# Patient Record
Sex: Female | Born: 1950 | Race: White | Hispanic: No | Marital: Married | Smoking: Never smoker
Health system: Southern US, Community
[De-identification: ages and names within clinical notes are randomized; demographics above are authoritative.]

## PROBLEM LIST (undated history)

## (undated) DIAGNOSIS — I10 Essential (primary) hypertension: Secondary | ICD-10-CM

## (undated) DIAGNOSIS — I2510 Atherosclerotic heart disease of native coronary artery without angina pectoris: Secondary | ICD-10-CM

## (undated) DIAGNOSIS — R51 Headache: Secondary | ICD-10-CM

## (undated) DIAGNOSIS — R112 Nausea with vomiting, unspecified: Secondary | ICD-10-CM

## (undated) DIAGNOSIS — Z9889 Other specified postprocedural states: Secondary | ICD-10-CM

## (undated) DIAGNOSIS — G4730 Sleep apnea, unspecified: Secondary | ICD-10-CM

## (undated) DIAGNOSIS — I209 Angina pectoris, unspecified: Secondary | ICD-10-CM

## (undated) DIAGNOSIS — R519 Headache, unspecified: Secondary | ICD-10-CM

## (undated) DIAGNOSIS — K219 Gastro-esophageal reflux disease without esophagitis: Secondary | ICD-10-CM

## (undated) HISTORY — PX: COLONOSCOPY: SHX5424

## (undated) HISTORY — PX: CARDIAC CATHETERIZATION: SHX172

## (undated) HISTORY — PX: MEDIAN STERNOTOMY: SUR860

## (undated) HISTORY — PX: CHOLECYSTECTOMY: SHX55

## (undated) HISTORY — PX: ENDARTERECTOMY: SHX5162

## (undated) HISTORY — PX: ESOPHAGOGASTRODUODENOSCOPY: SHX1529

## (undated) HISTORY — PX: AORTIC VALVE REPLACEMENT: SHX41

---

## 2012-08-01 HISTORY — PX: SPHENOIDECTOMY: SHX2421

## 2015-08-02 HISTORY — PX: ABDOMINAL HYSTERECTOMY: SHX81

## 2017-12-29 ENCOUNTER — Other Ambulatory Visit: Payer: Self-pay | Admitting: Neurological Surgery

## 2018-01-08 ENCOUNTER — Inpatient Hospital Stay (HOSPITAL_COMMUNITY): Admission: RE | Admit: 2018-01-08 | Payer: Self-pay | Source: Ambulatory Visit

## 2018-01-11 ENCOUNTER — Encounter (HOSPITAL_COMMUNITY): Admission: RE | Payer: Self-pay | Source: Ambulatory Visit

## 2018-01-11 ENCOUNTER — Inpatient Hospital Stay (HOSPITAL_COMMUNITY): Admission: RE | Admit: 2018-01-11 | Payer: Medicare Other | Source: Ambulatory Visit | Admitting: Neurological Surgery

## 2018-01-11 SURGERY — ANTERIOR CERVICAL DECOMPRESSION/DISCECTOMY FUSION 2 LEVELS
Anesthesia: General

## 2018-01-31 ENCOUNTER — Other Ambulatory Visit: Payer: Self-pay | Admitting: Neurological Surgery

## 2018-02-20 ENCOUNTER — Other Ambulatory Visit (HOSPITAL_COMMUNITY): Payer: Medicare Other

## 2018-02-20 NOTE — Pre-Procedure Instructions (Signed)
Eual FinesGail D Bartosiewicz  02/20/2018      [facility] [id number] - [place], [place] - [address] AT [facility] [address] [place] [id number] Phone: [phone] Fax: [phone]    Your procedure is scheduled on February 28, 2018.  [facility] at 745 AM.  Call this number if you have problems the morning of surgery:  [phone]   Remember:  Do not eat or drink after midnight.    Take these medicines the morning of surgery with A SIP OF WATER  Zyrtec Restasis eye drops Diazepam (valium)-if needed flonase nasal spray Meclizine (antivert) Metoprolol succinate (toprol-XL) Pantoprazole (protonix) sucrafate (carafate)-if needed Tramadol (ultram)-if needed for pain  Follow your surgeon's instructions on when to hold/resume aspirin.  If no instructions were given call the office to determine how they would like to you take aspirin  7 days prior to surgery STOP taking any diclofenac (voltaren), Aspirin (unless otherwise instructed by your surgeon), Aleve, Naproxen, Ibuprofen, Motrin, Advil, Goody's, BC's, all herbal medications, fish oil, and all vitamins    Do not wear jewelry, make-up or nail polish.  Do not wear lotions, powders, or perfumes, or deodorant.  Do not shave 48 hours prior to surgery.  Men may shave face and neck.  Do not bring valuables to the hospital.  [facility] is not responsible for any belongings or valuables.  Contacts, dentures or bridgework may not be worn into surgery.  Leave your suitcase in the car.  After surgery it may be brought to your room.  For patients admitted to the hospital, discharge time will be determined by your treatment team.  Patients discharged the day of surgery will not be allowed to drive home.    [facility]- Preparing For Surgery  Before surgery, you can play an important role. Because skin is not sterile, your skin needs to be as free of germs as  possible. You can reduce the number of germs on your skin by washing with CHG (chlorahexidine gluconate) Soap before surgery.  CHG is an antiseptic cleaner which kills germs and bonds with the skin to continue killing germs even after washing.    Oral Hygiene is also important to reduce your risk of infection.  Remember - BRUSH YOUR TEETH THE MORNING OF SURGERY WITH YOUR REGULAR TOOTHPASTE  Please do not use if you have an allergy to CHG or antibacterial soaps. If your skin becomes reddened/irritated stop using the CHG.  Do not shave (including legs and underarms) for at least 48 hours prior to first CHG shower. It is OK to shave your face.  Please follow these instructions carefully.   1. Shower the NIGHT BEFORE SURGERY and the MORNING OF SURGERY with CHG.   2. If you chose to wash your hair, wash your hair first as usual with your normal shampoo.  3. After you shampoo, rinse your hair and body thoroughly to remove the shampoo.  4. Use CHG as you would any other liquid soap. You can apply CHG directly to the skin and wash gently with a scrungie or a clean washcloth.   5. Apply the CHG Soap to your body ONLY FROM THE NECK DOWN.  Do not use on open wounds or open sores. Avoid contact with your eyes, ears, mouth and genitals (private parts). Wash Face and genitals (private parts)  with your normal soap.  6. Wash thoroughly, paying special attention to the area where your  surgery will be performed.  7. Thoroughly rinse your body with warm water from the neck down.  8. DO NOT shower/wash with your normal soap after using and rinsing off the CHG Soap.  9. Pat yourself dry with a CLEAN TOWEL.  10. Wear CLEAN PAJAMAS to bed the night before surgery, wear comfortable clothes the morning of surgery  11. Place CLEAN SHEETS on your bed the night of your first shower and DO NOT SLEEP WITH PETS.  Day of Surgery:  Do not apply any deodorants/lotions.  Please wear clean clothes to the  hospital/surgery center.   Remember to brush your teeth WITH YOUR REGULAR TOOTHPASTE.  Please read over the following fact sheets that you were given. Pain Booklet, Coughing and Deep Breathing, MRSA Information and Surgical Site Infection Prevention

## 2018-02-21 ENCOUNTER — Other Ambulatory Visit: Payer: Self-pay

## 2018-02-21 ENCOUNTER — Encounter (HOSPITAL_COMMUNITY)
Admission: RE | Admit: 2018-02-21 | Discharge: 2018-02-21 | Disposition: A | Discharge status: Home / Self Care | Payer: Medicare Other | Source: Ambulatory Visit | Attending: Neurological Surgery | Admitting: Neurological Surgery

## 2018-02-21 ENCOUNTER — Encounter (HOSPITAL_COMMUNITY): Payer: Self-pay

## 2018-02-21 DIAGNOSIS — M542 Cervicalgia: Secondary | ICD-10-CM | POA: Diagnosis not present

## 2018-02-21 DIAGNOSIS — I2510 Atherosclerotic heart disease of native coronary artery without angina pectoris: Secondary | ICD-10-CM | POA: Diagnosis not present

## 2018-02-21 DIAGNOSIS — G4730 Sleep apnea, unspecified: Secondary | ICD-10-CM | POA: Insufficient documentation

## 2018-02-21 DIAGNOSIS — Z79899 Other long term (current) drug therapy: Secondary | ICD-10-CM | POA: Insufficient documentation

## 2018-02-21 DIAGNOSIS — Z7982 Long term (current) use of aspirin: Secondary | ICD-10-CM | POA: Diagnosis not present

## 2018-02-21 DIAGNOSIS — I10 Essential (primary) hypertension: Secondary | ICD-10-CM | POA: Insufficient documentation

## 2018-02-21 DIAGNOSIS — K219 Gastro-esophageal reflux disease without esophagitis: Secondary | ICD-10-CM | POA: Diagnosis not present

## 2018-02-21 DIAGNOSIS — Z01818 Encounter for other preprocedural examination: Secondary | ICD-10-CM | POA: Diagnosis present

## 2018-02-21 HISTORY — DX: Angina pectoris, unspecified: I20.9

## 2018-02-21 HISTORY — DX: Other specified postprocedural states: Z98.890

## 2018-02-21 HISTORY — DX: Atherosclerotic heart disease of native coronary artery without angina pectoris: I25.10

## 2018-02-21 HISTORY — DX: Sleep apnea, unspecified: G47.30

## 2018-02-21 HISTORY — DX: Headache: R51

## 2018-02-21 HISTORY — DX: Headache, unspecified: R51.9

## 2018-02-21 HISTORY — DX: Other specified postprocedural states: R11.2

## 2018-02-21 HISTORY — DX: Gastro-esophageal reflux disease without esophagitis: K21.9

## 2018-02-21 HISTORY — DX: Essential (primary) hypertension: I10

## 2018-02-21 LAB — CBC WITH DIFFERENTIAL/PLATELET
ABS IMMATURE GRANULOCYTES: 0 10*3/uL (ref 0.0–0.1)
BASOS ABS: 0.1 10*3/uL (ref 0.0–0.1)
Basophils Relative: 1 %
Eosinophils Absolute: 0.3 10*3/uL (ref 0.0–0.7)
Eosinophils Relative: 3 %
HCT: 41.2 % (ref 36.0–46.0)
Hemoglobin: 13.7 g/dL (ref 12.0–15.0)
Immature Granulocytes: 0 %
Lymphocytes Relative: 31 %
Lymphs Abs: 2.7 10*3/uL (ref 0.7–4.0)
MCH: 29.7 pg (ref 26.0–34.0)
MCHC: 33.3 g/dL (ref 30.0–36.0)
MCV: 89.2 fL (ref 78.0–100.0)
Monocytes Absolute: 0.7 10*3/uL (ref 0.1–1.0)
Monocytes Relative: 8 %
NEUTROS ABS: 5 10*3/uL (ref 1.7–7.7)
Neutrophils Relative %: 57 %
PLATELETS: 238 10*3/uL (ref 150–400)
RBC: 4.62 MIL/uL (ref 3.87–5.11)
RDW: 12.7 % (ref 11.5–15.5)
WBC: 8.8 10*3/uL (ref 4.0–10.5)

## 2018-02-21 LAB — SURGICAL PCR SCREEN
MRSA, PCR: NEGATIVE
STAPHYLOCOCCUS AUREUS: NEGATIVE

## 2018-02-21 LAB — BASIC METABOLIC PANEL
ANION GAP: 11 (ref 5–15)
BUN: 28 mg/dL — ABNORMAL HIGH (ref 8–23)
CO2: 21 mmol/L — ABNORMAL LOW (ref 22–32)
Calcium: 9.6 mg/dL (ref 8.9–10.3)
Chloride: 107 mmol/L (ref 98–111)
Creatinine, Ser: 1 mg/dL (ref 0.44–1.00)
GFR, EST NON AFRICAN AMERICAN: 58 mL/min — AB (ref >60)
Glucose, Bld: 123 mg/dL — ABNORMAL HIGH (ref 70–99)
POTASSIUM: 4.2 mmol/L (ref 3.5–5.1)
SODIUM: 139 mmol/L (ref 135–145)

## 2018-02-21 LAB — PROTIME-INR
INR: 0.93
PROTHROMBIN TIME: 12.4 s (ref 11.4–15.2)

## 2018-02-21 NOTE — Progress Notes (Signed)
PCP - Marianna FussBrittany Hayes Cardiologist - Christie Nottinghamahul Sharma  Chest x-ray - not needed EKG - request Stress Test - request ECHO - request Cardiac Cath - request  Sleep Study - requesting CPAP - at night instructed to bring with her the moring   Aspirin Instructions:last dose 02/20/18  Anesthesia review: yes, records requested, aortic valve replacement  Patient denies shortness of breath, fever, cough and chest pain at PAT appointment   Patient verbalized understanding of instructions that were given to them at the PAT appointment. Patient was also instructed that they will need to review over the PAT instructions again at home before surgery.

## 2018-02-22 ENCOUNTER — Encounter (HOSPITAL_COMMUNITY): Payer: Self-pay

## 2018-02-22 NOTE — Progress Notes (Signed)
Anesthesia Chart Review:  Case:  [id number] Date/Time:  02/28/18 0930   Procedure:  ACDF - C4-C5 - C5-C6 (N/A )   Anesthesia type:  General   Pre-op diagnosis:  Cervicalgia   Location:  [facility] OR ROOM 19 / [facility] OR   Surgeon:  Tia AlertJones, David S, MD      DISCUSSION: Patient is a 67 year old female scheduled for the above procedure. 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 04/11/16, Amado Coeoselli, Eric, [facility], [facility]), PAF (post-op 04/2016, 05/2016), HTN, OSA (CPAP), GERD.  Cardiologist Dr. Wasco CallasSharma gave his "professional cardiac opinion that the patietn is otherwise cardiac-optimized for the above procedure."  If up to date EKG not received then she will need one on the day of surgery. By notes, she has not had recurrent PAF since ~ 05/2016. If no acute changes then I anticipate that she can proceed as planned.   VS: BP (!) 172/57   Pulse 70   Temp 36.8 C   Resp 20   Ht 5\' 3"  (1.6 m)   Wt 223 lb 9.6 oz (101.4 kg)   SpO2 98%   BMI 39.61 kg/m   PROVIDERS: Marianna FussHayes, Brittany, DO is PCP [facility], see Care Everywhere). (Sleep study was requested from Lorenza BurtonFavero, J. Patrick, [facility] Christie NottinghamSharma, Rahul, MD is cardiologist [facility], see Care Everywhere). Last office visit 11/08/17. Echo stable. Follow-up 06/2018 planned.    LABS: Labs reviewed: Acceptable for surgery. (all labs ordered are listed, but only abnormal results are displayed)  Labs Reviewed  BASIC METABOLIC PANEL - Abnormal; Notable for the following components:      Result Value   CO2 21 (*)    Glucose, Bld 123 (*)    BUN 28 (*)    GFR calc non Af Amer 58  (*)    All other components within normal limits  SURGICAL PCR SCREEN  CBC WITH DIFFERENTIAL/PLATELET  PROTIME-INR    IMAGES: CXR 10/03/17 [facility] Everywhere): IMPRESSION: No radiographic evidence of an acute cardiopulmonary process. No interval change.   EKG: 03/13/17 [facility]): According to result narrative in Care Everywhere, EKG showed: NSR, normal ECG. If unable to get a copy of an EKG tracing done within the past year then she will need an EKG on the day of surgery.    CV: Echo 11/08/17 [facility] Everywhere): Summary  1. Overall left ventricular ejection fraction is estimated at 60 to 65%.  2. Normal global left ventricular systolic function.  3. (Grade 2) Moderately abnormal left ventricular diastolic filling.  4. S/p basal septal myectomy.  5. Mild mitral annular calcification.  6. The aortic valve appears to be a normally functioning bioprosthetic valve.  7. Elevated left ventricular end diastolic pressure by tissue Doppler.  Cardiac cath 02/09/16 (PRE-AVR; [facility] Everywhere): Coronary Arteriography: Left Main Normal caliber vessel with no obstructive disease.  LAD Normal caliber transapical vessel with a normal branching first  diagonal. There is a 30% early mid LAD stenosis. The mid to distal LAD  takes an intramyocardial course and there is a moderate bridge.  Left Circumflex Normal caliber non-dominant vessel with a normal first  obtuse marginal, diminutive second and third diagonals and a large LPL. No  disease. RCA Normal caliber dominant system  with normal PDA normal PL. There is a  10-20% stenosis in the early mid RCA.  Hemodynamics:  See technician's report for full detail. The LVEDP was 22 mmHg. There was  a 60+mm Hg pressure gradient across the aortic valve. Using an end-hole  catheter an intracavitary pressure was sustained from apex to base. Left Ventriculography:  Deferred.  Carotid U/S 11/2213 [facility] Everywhere):   Impression:  1. The right ICA demonstrates mild plaque deposition with a stenosis in the lower limits of 0-39%. 2. The left ICA demonstrates mild plaque deposition with a stenosis in the lower limits of 0-39%.   Past Medical History:  Diagnosis Date  . Anginal pain (HCC)    hx  . Coronary artery disease   . GERD (gastroesophageal reflux disease)   . Headache    miagrains  . Hypertension   . PONV (postoperative nausea and vomiting)   . Sleep apnea    CPAP at night    Past Surgical History:  Procedure Laterality Date  . ABDOMINAL HYSTERECTOMY  2017  . AORTIC VALVE REPLACEMENT     23-mm Trifecta bioprosthesis, aortic root enlargement with bovine pericardal patch, left ventricular myectomy with reduction of papillary muscle 04/11/16, Dr. Nickie Retort, [facility]  . CARDIAC CATHETERIZATION    . CHOLECYSTECTOMY    . COLONOSCOPY    . ENDARTERECTOMY     ascending aorta 04/11/16 (during AVR)  . ESOPHAGOGASTRODUODENOSCOPY    . MEDIAN STERNOTOMY    . SPHENOIDECTOMY  2014   mass removed     MEDICATIONS: . aspirin EC 81 MG tablet  . cetirizine (ZYRTEC) 10 MG tablet  . cyclobenzaprine (FLEXERIL) 5 MG tablet  . cycloSPORINE (RESTASIS) 0.05 % ophthalmic emulsion  . diazepam (VALIUM) 2 MG tablet  . diclofenac (VOLTAREN) 75 MG EC tablet  . fluticasone (FLONASE) 50 MCG/ACT nasal spray  . ibuprofen (ADVIL,MOTRIN) 200 MG tablet  . meclizine (ANTIVERT) 12.5 MG tablet  . metoprolol succinate (TOPROL-XL) 100 MG 24 hr tablet  . montelukast (SINGULAIR) 10 MG tablet  . Multiple Vitamin (MULTIVITAMIN WITH MINERALS) TABS tablet  . Multiple Vitamins-Minerals (PRESERVISION AREDS 2 PO)  . NON FORMULARY  . olmesartan-hydrochlorothiazide (BENICAR HCT) 40-12.5 MG tablet  . pantoprazole (PROTONIX) 40 MG tablet  . simvastatin (ZOCOR) 20 MG tablet  . sucralfate (CARAFATE) 1 g tablet  . tapentadol (NUCYNTA) 50 MG tablet  . traMADol (ULTRAM) 50 MG tablet   No current facility-administered  medications for this encounter.    Last ASA dose 02/20/18.    Velna [facility] Short Stay Center/Anesthesiology Phone [phone] 02/22/2018 11:36 AM

## 2018-02-28 ENCOUNTER — Encounter (HOSPITAL_COMMUNITY)
Admission: RE | Disposition: A | Discharge status: Home / Self Care | Payer: Self-pay | Source: Ambulatory Visit | Attending: Neurological Surgery

## 2018-02-28 ENCOUNTER — Inpatient Hospital Stay (HOSPITAL_COMMUNITY): Payer: Medicare Other

## 2018-02-28 ENCOUNTER — Encounter (HOSPITAL_COMMUNITY): Payer: Self-pay | Admitting: Surgery

## 2018-02-28 ENCOUNTER — Observation Stay (HOSPITAL_COMMUNITY)
Admission: RE | Admit: 2018-02-28 | Discharge: 2018-03-01 | Disposition: A | Discharge status: Home / Self Care | Payer: Medicare Other | Source: Ambulatory Visit | Attending: Neurological Surgery | Admitting: Neurological Surgery

## 2018-02-28 ENCOUNTER — Inpatient Hospital Stay (HOSPITAL_COMMUNITY): Payer: Medicare Other | Admitting: Certified Registered Nurse Anesthetist

## 2018-02-28 ENCOUNTER — Other Ambulatory Visit: Payer: Self-pay

## 2018-02-28 ENCOUNTER — Inpatient Hospital Stay (HOSPITAL_COMMUNITY): Payer: Medicare Other | Admitting: Vascular Surgery

## 2018-02-28 DIAGNOSIS — Z91041 Radiographic dye allergy status: Secondary | ICD-10-CM | POA: Insufficient documentation

## 2018-02-28 DIAGNOSIS — M47812 Spondylosis without myelopathy or radiculopathy, cervical region: Principal | ICD-10-CM | POA: Insufficient documentation

## 2018-02-28 DIAGNOSIS — Z885 Allergy status to narcotic agent status: Secondary | ICD-10-CM | POA: Diagnosis not present

## 2018-02-28 DIAGNOSIS — Z79899 Other long term (current) drug therapy: Secondary | ICD-10-CM | POA: Insufficient documentation

## 2018-02-28 DIAGNOSIS — Z7982 Long term (current) use of aspirin: Secondary | ICD-10-CM | POA: Insufficient documentation

## 2018-02-28 DIAGNOSIS — I119 Hypertensive heart disease without heart failure: Secondary | ICD-10-CM | POA: Insufficient documentation

## 2018-02-28 DIAGNOSIS — Z7951 Long term (current) use of inhaled steroids: Secondary | ICD-10-CM | POA: Insufficient documentation

## 2018-02-28 DIAGNOSIS — G4730 Sleep apnea, unspecified: Secondary | ICD-10-CM | POA: Diagnosis not present

## 2018-02-28 DIAGNOSIS — I2510 Atherosclerotic heart disease of native coronary artery without angina pectoris: Secondary | ICD-10-CM | POA: Insufficient documentation

## 2018-02-28 DIAGNOSIS — Z952 Presence of prosthetic heart valve: Secondary | ICD-10-CM | POA: Diagnosis not present

## 2018-02-28 DIAGNOSIS — M4802 Spinal stenosis, cervical region: Secondary | ICD-10-CM | POA: Diagnosis not present

## 2018-02-28 DIAGNOSIS — M4322 Fusion of spine, cervical region: Secondary | ICD-10-CM | POA: Diagnosis present

## 2018-02-28 DIAGNOSIS — M50221 Other cervical disc displacement at C4-C5 level: Secondary | ICD-10-CM | POA: Diagnosis present

## 2018-02-28 DIAGNOSIS — Z791 Long term (current) use of non-steroidal anti-inflammatories (NSAID): Secondary | ICD-10-CM | POA: Diagnosis not present

## 2018-02-28 DIAGNOSIS — Z419 Encounter for procedure for purposes other than remedying health state, unspecified: Secondary | ICD-10-CM

## 2018-02-28 HISTORY — PX: ANTERIOR CERVICAL DECOMP/DISCECTOMY FUSION: SHX1161

## 2018-02-28 SURGERY — ANTERIOR CERVICAL DECOMPRESSION/DISCECTOMY FUSION 2 LEVELS
Anesthesia: General | Site: Spine Cervical

## 2018-02-28 MED ORDER — SODIUM CHLORIDE 0.9 % IV SOLN
INTRAVENOUS | Status: DC | PRN
  Administered 2018-02-28: 500 mL

## 2018-02-28 MED ORDER — LIDOCAINE 2% (20 MG/ML) 5 ML SYRINGE
INTRAMUSCULAR | Status: DC | PRN
  Administered 2018-02-28: 40 mg via INTRAVENOUS

## 2018-02-28 MED ORDER — SCOPOLAMINE 1 MG/3DAYS TD PT72
MEDICATED_PATCH | TRANSDERMAL | Status: AC
  Filled 2018-02-28: qty 1

## 2018-02-28 MED ORDER — CHLORHEXIDINE GLUCONATE CLOTH 2 % EX PADS: 6.0000 | MEDICATED_PAD | Freq: Once | CUTANEOUS | Status: DC

## 2018-02-28 MED ORDER — CEFAZOLIN SODIUM-DEXTROSE 2-4 GM/100ML-% IV SOLN
2.0000 g | Freq: Three times a day (TID) | INTRAVENOUS | Status: AC
  Administered 2018-02-28 – 2018-03-01 (×2): 2 g via INTRAVENOUS
  Filled 2018-02-28 (×2): qty 100

## 2018-02-28 MED ORDER — ONDANSETRON HCL 4 MG/2ML IJ SOLN
INTRAMUSCULAR | Status: DC | PRN
  Administered 2018-02-28: 4 mg via INTRAVENOUS

## 2018-02-28 MED ORDER — ACETAMINOPHEN 650 MG RE SUPP: 650.0000 mg | RECTAL | Status: DC | PRN

## 2018-02-28 MED ORDER — LACTATED RINGERS IV SOLN
INTRAVENOUS | Status: DC | PRN
  Administered 2018-02-28: 10:00:00 via INTRAVENOUS

## 2018-02-28 MED ORDER — CYCLOSPORINE 0.05 % OP EMUL
1.0000 [drp] | Freq: Two times a day (BID) | OPHTHALMIC | Status: DC
  Administered 2018-02-28: 1 [drp] via OPHTHALMIC
  Filled 2018-02-28 (×3): qty 1

## 2018-02-28 MED ORDER — LABETALOL HCL 5 MG/ML IV SOLN
INTRAVENOUS | Status: DC | PRN
  Administered 2018-02-28 (×4): 2.5 mg via INTRAVENOUS

## 2018-02-28 MED ORDER — ONDANSETRON HCL 4 MG PO TABS: 4.0000 mg | ORAL_TABLET | Freq: Four times a day (QID) | ORAL | Status: DC | PRN

## 2018-02-28 MED ORDER — THROMBIN 5000 UNITS EX SOLR
CUTANEOUS | Status: AC
  Filled 2018-02-28: qty 5000

## 2018-02-28 MED ORDER — MONTELUKAST SODIUM 10 MG PO TABS
10.0000 mg | ORAL_TABLET | Freq: Every day | ORAL | Status: DC
  Administered 2018-02-28: 10 mg via ORAL
  Filled 2018-02-28 (×2): qty 1

## 2018-02-28 MED ORDER — SCOPOLAMINE 1 MG/3DAYS TD PT72
1.0000 | MEDICATED_PATCH | Freq: Once | TRANSDERMAL | Status: DC
  Administered 2018-02-28: 1.5 mg via TRANSDERMAL
  Filled 2018-02-28: qty 1

## 2018-02-28 MED ORDER — PROPOFOL 500 MG/50ML IV EMUL
INTRAVENOUS | Status: DC | PRN
  Administered 2018-02-28: 11:00:00 via INTRAVENOUS
  Administered 2018-02-28: 200 ug/kg/min via INTRAVENOUS

## 2018-02-28 MED ORDER — BUPIVACAINE HCL (PF) 0.25 % IJ SOLN
INTRAMUSCULAR | Status: AC
  Filled 2018-02-28: qty 30

## 2018-02-28 MED ORDER — POTASSIUM CHLORIDE IN NACL 20-0.9 MEQ/L-% IV SOLN
INTRAVENOUS | Status: DC
  Administered 2018-02-28: 13:00:00 via INTRAVENOUS

## 2018-02-28 MED ORDER — MECLIZINE HCL 12.5 MG PO TABS
12.5000 mg | ORAL_TABLET | Freq: Two times a day (BID) | ORAL | Status: DC | PRN
  Filled 2018-02-28: qty 1

## 2018-02-28 MED ORDER — IRBESARTAN 300 MG PO TABS
300.0000 mg | ORAL_TABLET | Freq: Every day | ORAL | Status: DC
  Administered 2018-02-28: 300 mg via ORAL
  Filled 2018-02-28 (×2): qty 1

## 2018-02-28 MED ORDER — MIDAZOLAM HCL 5 MG/5ML IJ SOLN
INTRAMUSCULAR | Status: DC | PRN
  Administered 2018-02-28 (×2): 1 mg via INTRAVENOUS

## 2018-02-28 MED ORDER — HYDROMORPHONE HCL 1 MG/ML IJ SOLN: 0.5000 mg | INTRAMUSCULAR | Status: DC | PRN

## 2018-02-28 MED ORDER — FENTANYL CITRATE (PF) 250 MCG/5ML IJ SOLN
INTRAMUSCULAR | Status: DC | PRN
  Administered 2018-02-28: 75 ug via INTRAVENOUS
  Administered 2018-02-28 (×3): 50 ug via INTRAVENOUS
  Administered 2018-02-28: 75 ug via INTRAVENOUS

## 2018-02-28 MED ORDER — TAPENTADOL HCL 50 MG PO TABS
50.0000 mg | ORAL_TABLET | ORAL | Status: DC | PRN
  Administered 2018-02-28 – 2018-03-01 (×3): 50 mg via ORAL
  Filled 2018-02-28 (×3): qty 1

## 2018-02-28 MED ORDER — GELATIN ABSORBABLE MT POWD
OROMUCOSAL | Status: DC | PRN
  Administered 2018-02-28: 10:00:00

## 2018-02-28 MED ORDER — OLMESARTAN MEDOXOMIL-HCTZ 40-12.5 MG PO TABS: 1.0000 | ORAL_TABLET | Freq: Every day | ORAL | Status: DC

## 2018-02-28 MED ORDER — FENTANYL CITRATE (PF) 100 MCG/2ML IJ SOLN
INTRAMUSCULAR | Status: AC
  Filled 2018-02-28: qty 2

## 2018-02-28 MED ORDER — FENTANYL CITRATE (PF) 250 MCG/5ML IJ SOLN
INTRAMUSCULAR | Status: AC
  Filled 2018-02-28: qty 5

## 2018-02-28 MED ORDER — SODIUM CHLORIDE 0.9 % IV SOLN: 250.0000 mL | INTRAVENOUS | Status: DC

## 2018-02-28 MED ORDER — DEXAMETHASONE SODIUM PHOSPHATE 10 MG/ML IJ SOLN
INTRAMUSCULAR | Status: AC
  Filled 2018-02-28: qty 1

## 2018-02-28 MED ORDER — MENTHOL 3 MG MT LOZG
1.0000 | LOZENGE | OROMUCOSAL | Status: DC | PRN
  Filled 2018-02-28: qty 9

## 2018-02-28 MED ORDER — SUGAMMADEX SODIUM 200 MG/2ML IV SOLN
INTRAVENOUS | Status: AC
  Filled 2018-02-28: qty 2

## 2018-02-28 MED ORDER — BUPIVACAINE HCL (PF) 0.25 % IJ SOLN: INTRAMUSCULAR | Status: DC | PRN

## 2018-02-28 MED ORDER — MIDAZOLAM HCL 2 MG/2ML IJ SOLN
INTRAMUSCULAR | Status: AC
  Filled 2018-02-28: qty 2

## 2018-02-28 MED ORDER — PHENOL 1.4 % MT LIQD: 1.0000 | OROMUCOSAL | Status: DC | PRN

## 2018-02-28 MED ORDER — CYCLOBENZAPRINE HCL 5 MG PO TABS
5.0000 mg | ORAL_TABLET | Freq: Three times a day (TID) | ORAL | Status: DC | PRN
  Administered 2018-02-28: 5 mg via ORAL
  Filled 2018-02-28: qty 1

## 2018-02-28 MED ORDER — CEFAZOLIN SODIUM-DEXTROSE 2-4 GM/100ML-% IV SOLN
2.0000 g | INTRAVENOUS | Status: AC
  Administered 2018-02-28: 2 g via INTRAVENOUS
  Filled 2018-02-28: qty 100

## 2018-02-28 MED ORDER — PROPOFOL 1000 MG/100ML IV EMUL
INTRAVENOUS | Status: AC
  Filled 2018-02-28: qty 200

## 2018-02-28 MED ORDER — PROPOFOL 10 MG/ML IV BOLUS
INTRAVENOUS | Status: AC
  Filled 2018-02-28: qty 40

## 2018-02-28 MED ORDER — ACETAMINOPHEN 325 MG PO TABS: 650.0000 mg | ORAL_TABLET | ORAL | Status: DC | PRN

## 2018-02-28 MED ORDER — LABETALOL HCL 5 MG/ML IV SOLN
INTRAVENOUS | Status: AC
  Filled 2018-02-28: qty 4

## 2018-02-28 MED ORDER — METOPROLOL SUCCINATE ER 100 MG PO TB24
100.0000 mg | ORAL_TABLET | Freq: Every day | ORAL | Status: DC
  Administered 2018-02-28: 100 mg via ORAL
  Filled 2018-02-28: qty 1

## 2018-02-28 MED ORDER — HYDROCHLOROTHIAZIDE 12.5 MG PO CAPS
12.5000 mg | ORAL_CAPSULE | Freq: Every day | ORAL | Status: DC
  Administered 2018-02-28: 12.5 mg via ORAL
  Filled 2018-02-28: qty 1

## 2018-02-28 MED ORDER — SODIUM CHLORIDE 0.9 % IV SOLN
INTRAVENOUS | Status: DC | PRN
  Administered 2018-02-28: 25 ug/min via INTRAVENOUS

## 2018-02-28 MED ORDER — SUGAMMADEX SODIUM 200 MG/2ML IV SOLN
INTRAVENOUS | Status: DC | PRN
  Administered 2018-02-28: 200 mg via INTRAVENOUS

## 2018-02-28 MED ORDER — LACTATED RINGERS IV SOLN
INTRAVENOUS | Status: DC
  Administered 2018-02-28: 08:00:00 via INTRAVENOUS

## 2018-02-28 MED ORDER — SODIUM CHLORIDE 0.9% FLUSH: 3.0000 mL | Freq: Two times a day (BID) | INTRAVENOUS | Status: DC

## 2018-02-28 MED ORDER — ONDANSETRON HCL 4 MG/2ML IJ SOLN: 4.0000 mg | Freq: Four times a day (QID) | INTRAMUSCULAR | Status: DC | PRN

## 2018-02-28 MED ORDER — SODIUM CHLORIDE 0.9% FLUSH: 3.0000 mL | INTRAVENOUS | Status: DC | PRN

## 2018-02-28 MED ORDER — PROPOFOL 1000 MG/100ML IV EMUL
INTRAVENOUS | Status: AC
  Filled 2018-02-28: qty 100

## 2018-02-28 MED ORDER — 0.9 % SODIUM CHLORIDE (POUR BTL) OPTIME
TOPICAL | Status: DC | PRN
  Administered 2018-02-28: 1000 mL

## 2018-02-28 MED ORDER — ROCURONIUM BROMIDE 10 MG/ML (PF) SYRINGE
PREFILLED_SYRINGE | INTRAVENOUS | Status: AC
  Filled 2018-02-28: qty 10

## 2018-02-28 MED ORDER — ROCURONIUM BROMIDE 10 MG/ML (PF) SYRINGE
PREFILLED_SYRINGE | INTRAVENOUS | Status: DC | PRN
  Administered 2018-02-28: 50 mg via INTRAVENOUS
  Administered 2018-02-28: 20 mg via INTRAVENOUS

## 2018-02-28 MED ORDER — PANTOPRAZOLE SODIUM 40 MG PO TBEC
40.0000 mg | DELAYED_RELEASE_TABLET | Freq: Every day | ORAL | Status: DC
  Administered 2018-02-28: 40 mg via ORAL
  Filled 2018-02-28: qty 1

## 2018-02-28 MED ORDER — ONDANSETRON HCL 4 MG/2ML IJ SOLN: 4.0000 mg | Freq: Once | INTRAMUSCULAR | Status: DC | PRN

## 2018-02-28 MED ORDER — FENTANYL CITRATE (PF) 100 MCG/2ML IJ SOLN
25.0000 ug | INTRAMUSCULAR | Status: DC | PRN
  Administered 2018-02-28: 50 ug via INTRAVENOUS

## 2018-02-28 MED ORDER — DEXAMETHASONE SODIUM PHOSPHATE 10 MG/ML IJ SOLN
10.0000 mg | INTRAMUSCULAR | Status: AC
  Administered 2018-02-28: 10 mg via INTRAVENOUS
  Filled 2018-02-28: qty 1

## 2018-02-28 MED ORDER — SENNA 8.6 MG PO TABS
1.0000 | ORAL_TABLET | Freq: Two times a day (BID) | ORAL | Status: DC
  Administered 2018-02-28: 8.6 mg via ORAL
  Filled 2018-02-28: qty 1

## 2018-02-28 MED ORDER — ONDANSETRON HCL 4 MG/2ML IJ SOLN
INTRAMUSCULAR | Status: AC
  Filled 2018-02-28: qty 2

## 2018-02-28 MED ORDER — PROPOFOL 10 MG/ML IV BOLUS
INTRAVENOUS | Status: DC | PRN
  Administered 2018-02-28: 160 mg via INTRAVENOUS
  Administered 2018-02-28: 40 mg via INTRAVENOUS

## 2018-02-28 MED ORDER — LIDOCAINE 2% (20 MG/ML) 5 ML SYRINGE
INTRAMUSCULAR | Status: AC
Start: 2018-02-28 — End: ?
  Filled 2018-02-28: qty 5

## 2018-02-28 SURGICAL SUPPLY — 53 items
BAG DECANTER FOR FLEXI CONT (MISCELLANEOUS) ×2 IMPLANT
BASKET BONE COLLECTION (BASKET) ×2 IMPLANT
BENZOIN TINCTURE PRP APPL 2/3 (GAUZE/BANDAGES/DRESSINGS) ×2 IMPLANT
BIT DRILL 2.3 12 FIXED (INSTRUMENTS) ×1 IMPLANT
BUR MATCHSTICK NEURO 3.0 LAGG (BURR) ×2 IMPLANT
CANISTER SUCT 3000ML PPV (MISCELLANEOUS) ×2 IMPLANT
CARTRIDGE OIL MAESTRO DRILL (MISCELLANEOUS) ×1 IMPLANT
DIFFUSER DRILL AIR PNEUMATIC (MISCELLANEOUS) ×2 IMPLANT
DRAPE C-ARM 42X72 X-RAY (DRAPES) ×4 IMPLANT
DRAPE LAPAROTOMY 100X72 PEDS (DRAPES) ×2 IMPLANT
DRAPE MICROSCOPE LEICA (MISCELLANEOUS) ×2 IMPLANT
DRILL 12MM (INSTRUMENTS) ×2
DRSG OPSITE POSTOP 3X4 (GAUZE/BANDAGES/DRESSINGS) ×2 IMPLANT
DURAPREP 6ML APPLICATOR 50/CS (WOUND CARE) ×2 IMPLANT
ELECT COATED BLADE 2.86 ST (ELECTRODE) ×2 IMPLANT
ELECT REM PT RETURN 9FT ADLT (ELECTROSURGICAL) ×2
ELECTRODE REM PT RTRN 9FT ADLT (ELECTROSURGICAL) ×1 IMPLANT
GAUZE SPONGE 4X4 16PLY XRAY LF (GAUZE/BANDAGES/DRESSINGS) IMPLANT
GLOVE BIO SURGEON STRL SZ7 (GLOVE) ×6 IMPLANT
GLOVE BIO SURGEON STRL SZ8 (GLOVE) ×8 IMPLANT
GLOVE BIOGEL PI IND STRL 7.0 (GLOVE) ×3 IMPLANT
GLOVE BIOGEL PI IND STRL 7.5 (GLOVE) ×3 IMPLANT
GLOVE BIOGEL PI INDICATOR 7.0 (GLOVE) ×3
GLOVE BIOGEL PI INDICATOR 7.5 (GLOVE) ×3
GLOVE SURG 8.5 LATEX PF (GLOVE) ×2 IMPLANT
GLOVE SURG SS PI 7.0 STRL IVOR (GLOVE) ×6 IMPLANT
GOWN STRL REUS W/ TWL LRG LVL3 (GOWN DISPOSABLE) ×2 IMPLANT
GOWN STRL REUS W/ TWL XL LVL3 (GOWN DISPOSABLE) ×3 IMPLANT
GOWN STRL REUS W/TWL 2XL LVL3 (GOWN DISPOSABLE) IMPLANT
GOWN STRL REUS W/TWL LRG LVL3 (GOWN DISPOSABLE) ×2
GOWN STRL REUS W/TWL XL LVL3 (GOWN DISPOSABLE) ×3
HEMOSTAT POWDER KIT SURGIFOAM (HEMOSTASIS) ×2 IMPLANT
KIT BASIN OR (CUSTOM PROCEDURE TRAY) ×2 IMPLANT
KIT TURNOVER KIT B (KITS) ×2 IMPLANT
NEEDLE HYPO 25X1 1.5 SAFETY (NEEDLE) ×2 IMPLANT
NEEDLE SPNL 20GX3.5 QUINCKE YW (NEEDLE) ×2 IMPLANT
NS IRRIG 1000ML POUR BTL (IV SOLUTION) ×2 IMPLANT
OIL CARTRIDGE MAESTRO DRILL (MISCELLANEOUS) ×2
PACK LAMINECTOMY NEURO (CUSTOM PROCEDURE TRAY) ×2 IMPLANT
PAD ARMBOARD 7.5X6 YLW CONV (MISCELLANEOUS) ×6 IMPLANT
PIN DISTRACTION 14MM (PIN) ×4 IMPLANT
PLATE 30MM (Plate) ×2 IMPLANT
RUBBERBAND STERILE (MISCELLANEOUS) ×4 IMPLANT
SCREW 12MM (Screw) ×12 IMPLANT
SPACER PTI-C TI 7X14X12 7D (Spacer) ×4 IMPLANT
SPONGE INTESTINAL PEANUT (DISPOSABLE) ×2 IMPLANT
SPONGE SURGIFOAM ABS GEL SZ50 (HEMOSTASIS) IMPLANT
STRIP CLOSURE SKIN 1/2X4 (GAUZE/BANDAGES/DRESSINGS) ×2 IMPLANT
SUT VIC AB 3-0 SH 8-18 (SUTURE) ×2 IMPLANT
SUT VICRYL 4-0 PS2 18IN ABS (SUTURE) ×2 IMPLANT
TOWEL GREEN STERILE (TOWEL DISPOSABLE) ×2 IMPLANT
TOWEL GREEN STERILE FF (TOWEL DISPOSABLE) ×2 IMPLANT
WATER STERILE IRR 1000ML POUR (IV SOLUTION) ×2 IMPLANT

## 2018-02-28 NOTE — H&P (Signed)
Subjective:   Patient is a 67 y.o. female admitted for acdf. The patient first presented to me with complaints of neck pain, shooting pains in the arm(s) and numbness of the arm(s). Onset of symptoms was several months ago. The pain is described as aching and occurs all day. The pain is rated severe, and is located in the neck and radiates to the arms L>R. The symptoms have been progressive. Symptoms are exacerbated by extending head backwards, and are relieved by none.  Previous work up includes MRI of cervical spine, results: spinal stenosis.  Past Medical History:  Diagnosis Date  . Anginal pain (HCC)    hx  . Coronary artery disease   . GERD (gastroesophageal reflux disease)   . Headache    miagrains  . Hypertension   . PONV (postoperative nausea and vomiting)   . Sleep apnea    CPAP at night    Past Surgical History:  Procedure Laterality Date  . ABDOMINAL HYSTERECTOMY  2017  . AORTIC VALVE REPLACEMENT     23-mm Trifecta bioprosthesis, aortic root enlargement with bovine pericardal patch, left ventricular myectomy with reduction of papillary muscle 04/11/16, Dr. Nickie Retort, [facility]  . CARDIAC CATHETERIZATION    . CHOLECYSTECTOMY    . COLONOSCOPY    . ENDARTERECTOMY     ascending aorta 04/11/16 (during AVR)  . ESOPHAGOGASTRODUODENOSCOPY    . MEDIAN STERNOTOMY    . SPHENOIDECTOMY  2014   mass removed     Allergies  Allergen Reactions  . Codeine Nausea And Vomiting  . Ivp Dye [Iodinated Diagnostic Agents] Other (See Comments)    Broke out in whelps and redness on chest  . Morphine And Related Nausea And Vomiting    Social History   Tobacco Use  . Smoking status: Never Smoker  . Smokeless tobacco: Never Used  Substance Use Topics  . Alcohol use: Not Currently    History reviewed. No pertinent family history. Prior to Admission medications   Medication Sig Start Date End Date Taking? Authorizing Provider  aspirin EC 81 MG tablet Take 81 mg by mouth every evening.     Yes [provider]  cetirizine (ZYRTEC) 10 MG tablet Take 10 mg by mouth daily.   Yes [provider]  cyclobenzaprine (FLEXERIL) 5 MG tablet Take 5 mg by mouth 3 (three) times daily as needed for muscle spasms.   Yes [provider]  cycloSPORINE (RESTASIS) 0.05 % ophthalmic emulsion Place 1 drop into both eyes 2 (two) times daily.   Yes [provider]  diazepam (VALIUM) 2 MG tablet Take 2 mg by mouth 2 (two) times daily as needed (for vertigo).   Yes [provider]  diclofenac (VOLTAREN) 75 MG EC tablet Take 75 mg by mouth 2 (two) times daily as needed for mild pain.   Yes [provider]  fluticasone (FLONASE) 50 MCG/ACT nasal spray Place 1 spray into both nostrils daily.   Yes [provider]  ibuprofen (ADVIL,MOTRIN) 200 MG tablet Take 200 mg by mouth daily as needed for headache or moderate pain.   Yes [provider]  meclizine (ANTIVERT) 12.5 MG tablet Take 12.5 mg by mouth 2 (two) times daily as needed for dizziness.    Yes [provider]  metoprolol succinate (TOPROL-XL) 100 MG 24 hr tablet Take 100 mg by mouth at bedtime.   Yes [provider]  montelukast (SINGULAIR) 10 MG tablet Take 10 mg by mouth at bedtime.   Yes [provider]  Multiple Vitamin (MULTIVITAMIN WITH MINERALS) TABS tablet Take 1 tablet by mouth daily.   Yes [provider]  Multiple Vitamins-Minerals (PRESERVISION AREDS 2 PO) Take 1 capsule by mouth 2 (two) times daily.   Yes [provider]  NON FORMULARY Inject 5 mLs into the skin every Sunday. Allergy Shot provided by MD to administer at home   Yes [provider]  olmesartan-hydrochlorothiazide (BENICAR HCT) 40-12.5 MG tablet Take 1 tablet by mouth daily.   Yes [provider]  pantoprazole (PROTONIX) 40 MG tablet Take 40 mg by mouth at bedtime.   Yes [provider]  simvastatin (ZOCOR) 20 MG tablet Take 20 mg by  mouth at bedtime.   Yes [provider]  sucralfate (CARAFATE) 1 g tablet Take 1 g by mouth daily as needed (acid reflux).   Yes [provider]  tapentadol (NUCYNTA) 50 MG tablet Take 50 mg by mouth every 4 (four) hours as needed for severe pain.   Yes [provider]  traMADol (ULTRAM) 50 MG tablet Take 50 mg by mouth every 6 (six) hours as needed for moderate pain.   Yes [provider]     Review of Systems  Positive ROS: neg  All other systems have been reviewed and were otherwise negative with the exception of those mentioned in the HPI and as above.  Objective: Vital signs in last 24 hours: Temp:  [98.1 F (36.7 C)] 98.1 F (36.7 C) (07/31 0745) Pulse Rate:  [69] 69 (07/31 0745) Resp:  [20] 20 (07/31 0745) BP: (158)/(73) 158/73 (07/31 0745) SpO2:  [98 %] 98 % (07/31 0745) Weight:  [101.2 kg (223 lb)] 101.2 kg (223 lb) (07/31 0745)  General Appearance: Alert, cooperative, no distress, appears stated age Head: Normocephalic, without obvious abnormality, atraumatic Eyes: PERRL, conjunctiva/corneas clear, EOM's intact      Neck: Supple, symmetrical, trachea midline, Back: Symmetric, no curvature, ROM normal, no CVA tenderness Lungs:  respirations unlabored Heart: Regular rate and rhythm Abdomen: Soft, non-tender Extremities: Extremities normal, atraumatic, no cyanosis or edema Pulses: 2+ and symmetric all extremities Skin: Skin color, texture, turgor normal, no rashes or lesions  NEUROLOGIC:  Mental status: Alert and oriented x4, no aphasia, good attention span, fund of knowledge and memory  Motor Exam - grossly normal Sensory Exam - grossly normal Reflexes: 1+ Coordination - grossly normal Gait - grossly normal Balance - grossly normal Cranial Nerves: I: smell Not tested  II: visual acuity  OS: nl    OD: nl  II: visual fields Full to confrontation  II: pupils Equal, round, reactive to light  III,VII: ptosis None  III,IV,VI:  extraocular muscles  Full ROM  V: mastication Normal  V: facial light touch sensation  Normal  V,VII: corneal reflex  Present  VII: facial muscle function - upper  Normal  VII: facial muscle function - lower Normal  VIII: hearing Not tested  IX: soft palate elevation  Normal  IX,X: gag reflex Present  XI: trapezius strength  5/5  XI: sternocleidomastoid strength 5/5  XI: neck flexion strength  5/5  XII: tongue strength  Normal    Data Review Lab Results  Component Value Date   WBC 8.8 02/21/2018   HGB 13.7 02/21/2018   HCT 41.2 02/21/2018   MCV 89.2 02/21/2018   PLT 238 02/21/2018   Lab Results  Component Value Date   NA 139 02/21/2018   K 4.2 02/21/2018   CL 107 02/21/2018   CO2 21 (L) 02/21/2018  BUN 28 (H) 02/21/2018   CREATININE 1.00 02/21/2018   GLUCOSE 123 (H) 02/21/2018   Lab Results  Component Value Date   INR 0.93 02/21/2018    Assessment:   Cervical neck pain with herniated nucleus pulposus/ spondylosis/ stenosis at C4-5 C5-6. Estimated body mass index is 39.5 kg/m as calculated from the following:   Height as of this encounter: 5\' 3"  (1.6 m).   Weight as of this encounter: 101.2 kg (223 lb).  Patient has failed conservative therapy. Planned surgery : ACDF C4-5 C5-6  Plan:   I explained the condition and procedure to the patient and answered any questions.  Patient wishes to proceed with procedure as planned. Understands risks/ benefits/ and expected or typical outcomes.  Jermaine Neuharth S 02/28/2018 9:18 AM

## 2018-02-28 NOTE — Op Note (Signed)
02/28/2018  11:47 AM  PATIENT:  Beth Stokes  67 y.o. 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 Decompressive anterior cervical discectomy C4-5 C5-6, 2. Anterior cervical arthrodesis C4-5 C5-6 utilizing a porous titanium interbody cage packed with locally harvested morcellized autologous bone graft, 3. Anterior cervical plating C4-5 C5-6 utilizing a Alphatec plate  SURGEON:  Marikay Alar, MD  ASSISTANTS: Verlin Dike and FNP  ANESTHESIA:   General  EBL: less than 50 ml  Total I/O In: -  Out: 100 [Blood:100]  BLOOD ADMINISTERED: none  DRAINS: none  SPECIMEN:  none  INDICATION FOR PROCEDURE: This patient presented with neck and arm pain. Imaging showedcervical spondylosis with stenosis. The patient tried conservative measures without relief. Pain was debilitating. Recommended ACDF with plating. Patient understood the risks, benefits, and alternatives and potential outcomes and wished to proceed.  PROCEDURE DETAILS: Patient was brought to the operating room placed under general endotracheal anesthesia. Patient was placed in the supine position on the operating room table. The neck was prepped with Duraprep and draped in a sterile fashion.   Three cc of local anesthesia was injected and a transverse incision was made on the right side of the neck.  Dissection was carried down thru the subcutaneous tissue and the platysma was  elevated, opened, and undermined with Metzenbaum scissors.  Dissection was then carried out thru an avascular plane leaving the sternocleidomastoid carotid artery and jugular vein laterally and the trachea and esophagus medially. The ventral aspect of the vertebral column was identified and a localizing x-ray was taken. The C4-5 level was identified. The longus colli muscles were then elevated and the retractor was  placed. The annulus C4-5 and C5-6was incised and the disc space entered. Discectomy was performed exactly the same at both levels and with micro-curettes and pituitary rongeurs. I then used the high-speed drill to drill the endplates down to the level of the posterior longitudinal ligament. The drill shavings were saved in a mucous trap for later arthrodesis. The operating microscope was draped and brought into the field provided additional magnification, illumination and visualization. Discectomy was continued posteriorly thru the disc space. Posterior longitudinal ligament was opened with a nerve hook, and then removed along with disc herniation and osteophytes, decompressing the spinal canal and thecal sac at both levels. We then continued to remove osteophytic overgrowth and disc material decompressing the neural foramina and exiting nerve roots bilaterally. The scope was angled up and down to help decompress and undercut the vertebral bodies. Once the decompression was completed we could pass a nerve hook circumferentially to assure adequate decompression in the midline and in the neural foramina. So by both visualization and palpation we felt we had an adequate decompression of the neural elements. We then measured the height of the intravertebral disc space and selected a 7 millimeter porous titanium interbody cage packed with autograft. It was then gently positioned in the intravertebral disc space(s) and countersunk. I then used a Alphatec plate and placed variable angle screws into the vertebral bodies of each level and locked them into position. The wound was irrigated with bacitracin solution, checked for hemostasis which was established and confirmed. Once meticulous hemostasis was achieved, we then proceeded with closure. The platysma was closed with interrupted 3-0 undyed Vicryl suture, the subcuticular layer was closed with interrupted 3-0 undyed Vicryl suture. The skin edges were approximated with  steristrips. The drapes were removed. A sterile dressing was applied. The patient was then awakened from general anesthesia and transferred to the recovery room in stable condition. At the end of the procedure all sponge, needle and instrument counts were correct.   PLAN OF CARE: Admit for overnight observation  PATIENT DISPOSITION:  PACU - hemodynamically stable.   Delay start of Pharmacological VTE agent (>24hrs) due to surgical blood loss or risk of bleeding:  yes

## 2018-02-28 NOTE — Anesthesia Preprocedure Evaluation (Signed)
Anesthesia Evaluation  Patient identified by MRN, date of birth, ID band Patient awake    Reviewed: Allergy & Precautions, NPO status , Patient's Chart, lab work & pertinent test results  Airway Mallampati: II  TM Distance: >3 FB Neck ROM: Limited    Dental  (+) Teeth Intact, Dental Advisory Given   Pulmonary    breath sounds clear to auscultation       Cardiovascular hypertension,  Rhythm:Regular Rate:Normal     Neuro/Psych    GI/Hepatic   Endo/Other    Renal/GU      Musculoskeletal   Abdominal (+) + obese,   Peds  Hematology   Anesthesia Other Findings   Reproductive/Obstetrics                             Anesthesia Physical Anesthesia Plan  ASA: III  Anesthesia Plan: General   Post-op Pain Management:    Induction: Intravenous  PONV Risk Score and Plan: 2 and Ondansetron, Dexamethasone, Propofol infusion, Midazolam and Scopolamine patch - Pre-op  Airway Management Planned: Oral ETT  Additional Equipment:   Intra-op Plan:   Post-operative Plan: Extubation in OR  Informed Consent: I have reviewed the patients History and Physical, chart, labs and discussed the procedure including the risks, benefits and alternatives for the proposed anesthesia with the patient or authorized representative who has indicated his/her understanding and acceptance.   Dental advisory given  Plan Discussed with: CRNA and Anesthesiologist  Anesthesia Plan Comments:         Anesthesia Quick Evaluation

## 2018-02-28 NOTE — Anesthesia Postprocedure Evaluation (Signed)
Anesthesia Post Note  Patient: Beth Stokes  Procedure(s) Performed: Anterior Cervical Decompression Fusion - Cervical Four - Cervical Five - Cervical Five - Cervical Six (N/A Spine Cervical)     Patient location during evaluation: PACU Anesthesia Type: General Level of consciousness: awake and alert Pain management: pain level controlled Vital Signs Assessment: post-procedure vital signs reviewed and stable Respiratory status: spontaneous breathing, nonlabored ventilation, respiratory function stable and patient connected to nasal cannula oxygen Cardiovascular status: blood pressure returned to baseline and stable Postop Assessment: no apparent nausea or vomiting Anesthetic complications: no    Last Vitals:  Vitals:   02/28/18 1237 02/28/18 1302  BP: (!) 156/68 (!) 167/65  Pulse: 73 82  Resp: 11 16  Temp:  37.1 C  SpO2: 97% 94%    Last Pain:  Vitals:   02/28/18 1302  TempSrc: Oral  PainSc:                  Beth Stokes

## 2018-02-28 NOTE — Anesthesia Procedure Notes (Signed)
Procedure Name: Intubation Date/Time: 02/28/2018 9:40 AM Performed by: Waynard EdwardsSmith, Stephaun Million A, CRNA Pre-anesthesia Checklist: Patient identified, Emergency Drugs available, Suction available and Patient being monitored Patient Re-evaluated:Patient Re-evaluated prior to induction Oxygen Delivery Method: Circle system utilized Preoxygenation: Pre-oxygenation with 100% oxygen Induction Type: IV induction Ventilation: Mask ventilation without difficulty and Oral airway inserted - appropriate to patient size Laryngoscope Size: Hyacinth MeekerMiller and 2 Grade View: Grade I Tube type: Oral Tube size: 7.0 mm Number of attempts: 1 Airway Equipment and Method: Stylet Placement Confirmation: ETT inserted through vocal cords under direct vision,  positive ETCO2 and breath sounds checked- equal and bilateral Secured at: 21 cm Tube secured with: Tape Dental Injury: Teeth and Oropharynx as per pre-operative assessment

## 2018-02-28 NOTE — Plan of Care (Signed)

## 2018-02-28 NOTE — Transfer of Care (Signed)
Immediate Anesthesia Transfer of Care Note  Patient: Beth Stokes  Procedure(s) Performed: Anterior Cervical Decompression Fusion - Cervical Four - Cervical Five - Cervical Five - Cervical Six (N/A Spine Cervical)  Patient Location: PACU  Anesthesia Type:General  Level of Consciousness: drowsy and patient cooperative  Airway & Oxygen Therapy: Patient Spontanous Breathing and Patient connected to nasal cannula oxygen  Post-op Assessment: Report given to RN, Post -op Vital signs reviewed and stable and Patient moving all extremities X 4  Post vital signs: Reviewed and stable  Last Vitals:  Vitals Value Taken Time  BP 161/60 02/28/2018 11:51 AM  Temp 36.2 C 02/28/2018 11:51 AM  Pulse 86 02/28/2018 11:53 AM  Resp 17 02/28/2018 11:53 AM  SpO2 93 % 02/28/2018 11:53 AM  Vitals shown include unvalidated device data.  Last Pain:  Vitals:   02/28/18 0756  PainSc: 4       Patients Stated Pain Goal: 4 (02/28/18 0756)  Complications: No apparent anesthesia complications

## 2018-03-01 ENCOUNTER — Encounter (HOSPITAL_COMMUNITY): Payer: Self-pay | Admitting: Neurological Surgery

## 2018-03-01 DIAGNOSIS — M47812 Spondylosis without myelopathy or radiculopathy, cervical region: Secondary | ICD-10-CM | POA: Diagnosis not present

## 2018-03-01 MED ORDER — METHOCARBAMOL 500 MG PO TABS: 500.0000 mg | ORAL_TABLET | Freq: Four times a day (QID) | ORAL | 0 refills | Status: AC

## 2018-03-01 MED ORDER — HYDROCODONE-ACETAMINOPHEN 5-325 MG PO TABS: 1.0000 | ORAL_TABLET | ORAL | 0 refills | Status: AC | PRN

## 2018-03-01 NOTE — Discharge Summary (Signed)
Physician Discharge Summary  Patient ID: Beth Stokes MRN: [id number] DOB/AGE: Jan 16, 1951 67 y.o.  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 The patient tolerated the procedure well and was taken to the recovery room and then to the floor in stable condition. The hospital course was routine. There were no complications. The wound remained clean dry and intact. Pt had appropriate neck soreness. No complaints of arm pain or new N/T/W. The patient remained afebrile with stable vital signs, and tolerated a regular diet. The patient continued to increase activities, and pain was well controlled with oral pain medications.   Consults: None  Significant Diagnostic Studies:  Results for orders placed or performed during the hospital encounter of 02/21/18  Surgical pcr screen  Result Value Ref Range   MRSA, PCR NEGATIVE NEGATIVE   Staphylococcus aureus NEGATIVE NEGATIVE  Basic metabolic panel  Result Value Ref Range   Sodium 139 135 - 145 mmol/L   Potassium 4.2 3.5 - 5.1 mmol/L   Chloride 107 98 - 111 mmol/L   CO2 21 (L) 22 - 32 mmol/L   Glucose, Bld 123 (H) 70 - 99 mg/dL   BUN 28 (H) 8 - 23 mg/dL   Creatinine, Ser 4.09 0.44 - 1.00 mg/dL   Calcium 9.6 8.9 - 81.1 mg/dL   GFR calc non Af Amer 58 (L) >60 mL/min   GFR calc Af Amer >60 >60 mL/min   Anion gap 11 5 - 15  CBC WITH DIFFERENTIAL  Result Value Ref Range   WBC 8.8 4.0 - 10.5 K/uL   RBC 4.62 3.87 - 5.11 MIL/uL   Hemoglobin 13.7 12.0 - 15.0 g/dL   HCT 91.4 78.2 - 95.6 %   MCV 89.2 78.0 - 100.0 fL   MCH 29.7 26.0 - 34.0 pg   MCHC 33.3 30.0 - 36.0 g/dL   RDW 21.3 08.6 - 57.8 %   Platelets 238 150 - 400 K/uL    Neutrophils Relative % 57 %   Neutro Abs 5.0 1.7 - 7.7 K/uL   Lymphocytes Relative 31 %   Lymphs Abs 2.7 0.7 - 4.0 K/uL   Monocytes Relative 8 %   Monocytes Absolute 0.7 0.1 - 1.0 K/uL   Eosinophils Relative 3 %   Eosinophils Absolute 0.3 0.0 - 0.7 K/uL   Basophils Relative 1 %   Basophils Absolute 0.1 0.0 - 0.1 K/uL   Immature Granulocytes 0 %   Abs Immature Granulocytes 0.0 0.0 - 0.1 K/uL  Protime-INR  Result Value Ref Range   Prothrombin Time 12.4 11.4 - 15.2 seconds   INR 0.93     Dg Cervical Spine 2-3 Views  Result Date: 02/28/2018 CLINICAL DATA:  ACDF. EXAM: DG C-ARM 61-120 MIN; CERVICAL SPINE - 2-3 VIEW COMPARISON:  None. FINDINGS: Multiple C-arm images show 2 level ACDF from C4-C6. Interbody spacers are in place with anterior plate and screw fixation. No radiographically detectable complication. IMPRESSION: ACDF C4-C6. Electronically Signed   By: Paulina Fusi M.D.   On: 02/28/2018 11:41   Dg C-arm 1-60 Min  Result Date: 02/28/2018 CLINICAL DATA:  ACDF. EXAM: DG C-ARM 61-120 MIN; CERVICAL SPINE - 2-3 VIEW COMPARISON:  None. FINDINGS: Multiple C-arm images show 2 level ACDF from C4-C6.  Interbody spacers are in place with anterior plate and screw fixation. No radiographically detectable complication. IMPRESSION: ACDF C4-C6. Electronically Signed   By: Paulina FusiMark  Shogry M.D.   On: 02/28/2018 11:41    Antibiotics:  Anti-infectives (From admission, onward)   Start     Dose/Rate Route Frequency Ordered Stop   02/28/18 1800  ceFAZolin (ANCEF) IVPB 2g/100 mL premix     2 g 200 mL/hr over 30 Minutes Intravenous Every 8 hours 02/28/18 1257 03/01/18 0219   02/28/18 1012  bacitracin 50,000 Units in sodium chloride 0.9 % 500 mL irrigation  Status:  Discontinued       As needed 02/28/18 1013 02/28/18 1144   02/28/18 0730  ceFAZolin (ANCEF) IVPB 2g/100 mL premix     2 g 200 mL/hr over 30 Minutes Intravenous On call to O.R. 02/28/18 0716 02/28/18 0944      Discharge Exam: Blood  pressure (!) 138/52, pulse 73, temperature 98.5 F (36.9 C), temperature source Oral, resp. rate 20, height 5\' 3"  (1.6 m), weight 101.2 kg (223 lb), SpO2 94 %. Neurologic: Grossly normal Ambulating and voiding well  Discharge Medications:   Allergies as of 03/01/2018      Reactions   Codeine Nausea And Vomiting   Ivp Dye [iodinated Diagnostic Agents] Other (See Comments)   Broke out in whelps and redness on chest   Morphine And Related Nausea And Vomiting      Medication List    TAKE these medications   aspirin EC 81 MG tablet Take 81 mg by mouth every evening.   cetirizine 10 MG tablet Commonly known as:  ZYRTEC Take 10 mg by mouth daily.   cyclobenzaprine 5 MG tablet Commonly known as:  FLEXERIL Take 5 mg by mouth 3 (three) times daily as needed for muscle spasms.   cycloSPORINE 0.05 % ophthalmic emulsion Commonly known as:  RESTASIS Place 1 drop into both eyes 2 (two) times daily.   diazepam 2 MG tablet Commonly known as:  VALIUM Take 2 mg by mouth 2 (two) times daily as needed (for vertigo).   diclofenac 75 MG EC tablet Commonly known as:  VOLTAREN Take 75 mg by mouth 2 (two) times daily as needed for mild pain.   fluticasone 50 MCG/ACT nasal spray Commonly known as:  FLONASE Place 1 spray into both nostrils daily.   HYDROcodone-acetaminophen 5-325 MG tablet Commonly known as:  NORCO/VICODIN Take 1 tablet by mouth every 4 (four) hours as needed for moderate pain.   ibuprofen 200 MG tablet Commonly known as:  ADVIL,MOTRIN Take 200 mg by mouth daily as needed for headache or moderate pain.   meclizine 12.5 MG tablet Commonly known as:  ANTIVERT Take 12.5 mg by mouth 2 (two) times daily as needed for dizziness.   methocarbamol 500 MG tablet Commonly known as:  ROBAXIN Take 1 tablet (500 mg total) by mouth 4 (four) times daily.   metoprolol succinate 100 MG 24 hr tablet Commonly known as:  TOPROL-XL Take 100 mg by mouth at bedtime.   montelukast 10 MG  tablet Commonly known as:  SINGULAIR Take 10 mg by mouth at bedtime.   multivitamin with minerals Tabs tablet Take 1 tablet by mouth daily.   NON FORMULARY Inject 5 mLs into the skin every Sunday. Allergy Shot provided by MD to administer at home   olmesartan-hydrochlorothiazide 40-12.5 MG tablet Commonly known as:  BENICAR HCT Take 1 tablet by mouth daily.   pantoprazole 40 MG tablet Commonly known as:  PROTONIX Take  40 mg by mouth at bedtime.   PRESERVISION AREDS 2 PO Take 1 capsule by mouth 2 (two) times daily.   simvastatin 20 MG tablet Commonly known as:  ZOCOR Take 20 mg by mouth at bedtime.   sucralfate 1 g tablet Commonly known as:  CARAFATE Take 1 g by mouth daily as needed (acid reflux).   tapentadol 50 MG tablet Commonly known as:  NUCYNTA Take 50 mg by mouth every 4 (four) hours as needed for severe pain.   traMADol 50 MG tablet Commonly known as:  ULTRAM Take 50 mg by mouth every 6 (six) hours as needed for moderate pain.       Disposition: home   Final Dx: acdfc4-5,5-6  Discharge Instructions     Remove dressing in 72 hours   Complete by:  As directed    Call MD for:  difficulty breathing, headache or visual disturbances   Complete by:  As directed    Call MD for:  hives   Complete by:  As directed    Call MD for:  persistant dizziness or light-headedness   Complete by:  As directed    Call MD for:  persistant nausea and vomiting   Complete by:  As directed    Call MD for:  redness, tenderness, or signs of infection (pain, swelling, redness, odor or green/yellow discharge around incision site)   Complete by:  As directed    Call MD for:  severe uncontrolled pain   Complete by:  As directed    Call MD for:  temperature >100.4   Complete by:  As directed    Diet - low sodium heart healthy   Complete by:  As directed    Driving Restrictions   Complete by:  As directed    No driving 2 weeks   Increase activity slowly   Complete by:  As  directed    Lifting restrictions   Complete by:  As directed    Nothing heavier than 8 lbs      Follow-up Information    Tia Alert, MD. Schedule an appointment as soon as possible for a visit in 2 week(s).   Specialty:  Neurosurgery Contact information: [address] [place] [address] [phone]            Signed: Tiana Loft A Rosie Place 03/01/2018, 7:13 AM

## 2019-06-04 ENCOUNTER — Other Ambulatory Visit: Payer: Self-pay | Admitting: Neurological Surgery

## 2019-06-04 DIAGNOSIS — M542 Cervicalgia: Secondary | ICD-10-CM

## 2019-06-14 ENCOUNTER — Ambulatory Visit
Admission: RE | Admit: 2019-06-14 | Discharge: 2019-06-14 | Disposition: A | Discharge status: Home / Self Care | Payer: Medicare Other | Source: Ambulatory Visit | Attending: Neurological Surgery | Admitting: Neurological Surgery

## 2019-06-14 ENCOUNTER — Other Ambulatory Visit: Payer: Self-pay

## 2019-06-14 DIAGNOSIS — M542 Cervicalgia: Secondary | ICD-10-CM

## 2021-04-01 ENCOUNTER — Other Ambulatory Visit: Payer: Self-pay | Admitting: Student

## 2021-04-01 ENCOUNTER — Other Ambulatory Visit (HOSPITAL_COMMUNITY): Payer: Self-pay | Admitting: Student

## 2021-04-01 DIAGNOSIS — M542 Cervicalgia: Secondary | ICD-10-CM

## 2021-04-13 ENCOUNTER — Ambulatory Visit (HOSPITAL_COMMUNITY)
Admission: RE | Admit: 2021-04-13 | Discharge: 2021-04-13 | Disposition: A | Discharge status: Home / Self Care | Payer: Medicare Other | Source: Ambulatory Visit | Attending: Student | Admitting: Student

## 2021-04-13 ENCOUNTER — Other Ambulatory Visit: Payer: Self-pay

## 2021-04-13 DIAGNOSIS — M542 Cervicalgia: Secondary | ICD-10-CM

## 2021-05-27 ENCOUNTER — Other Ambulatory Visit (HOSPITAL_COMMUNITY): Payer: Self-pay | Admitting: Neurological Surgery

## 2021-05-27 ENCOUNTER — Other Ambulatory Visit (HOSPITAL_BASED_OUTPATIENT_CLINIC_OR_DEPARTMENT_OTHER): Payer: Self-pay | Admitting: Neurological Surgery

## 2021-05-27 DIAGNOSIS — M542 Cervicalgia: Secondary | ICD-10-CM

## 2021-07-01 ENCOUNTER — Encounter (HOSPITAL_COMMUNITY): Payer: Self-pay

## 2021-07-01 ENCOUNTER — Ambulatory Visit (HOSPITAL_COMMUNITY): Payer: Medicare Other

## 2022-05-23 ENCOUNTER — Other Ambulatory Visit: Payer: Self-pay | Admitting: Neurological Surgery

## 2022-05-23 DIAGNOSIS — M542 Cervicalgia: Secondary | ICD-10-CM

## 2023-08-29 IMAGING — MR MR CERVICAL SPINE W/O CM
5 series · 39 of 48 positions shown · non-contrast
Comparison: MRI of the cervical spine May 21, 2019.

CLINICAL DATA: Cervicalgia.

EXAM:
MRI CERVICAL SPINE WITHOUT CONTRAST
TECHNIQUE: Multiplanar, multisequence MR imaging of the cervical spine was
performed. No intravenous contrast was administered.

[Series 5: T2 · sagittal · 3.0mm · 0.69mm/px · 6 of 15 slices shown (1 of 2)]
[im 1/15]
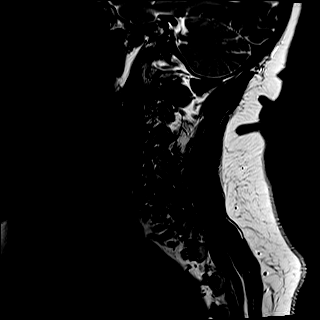
[im 3/15]
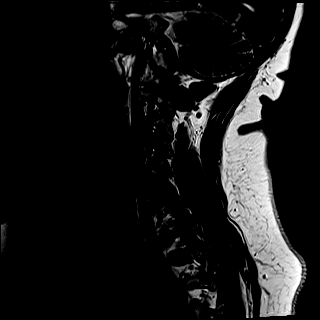
[im 6/15]
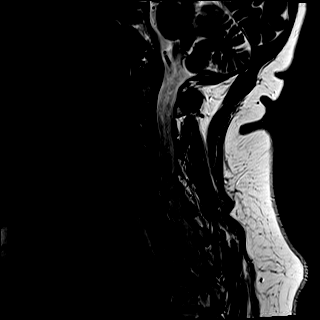
[im 9/15]
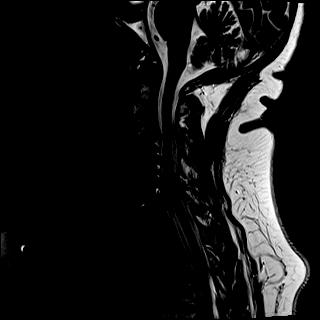
[im 12/15]
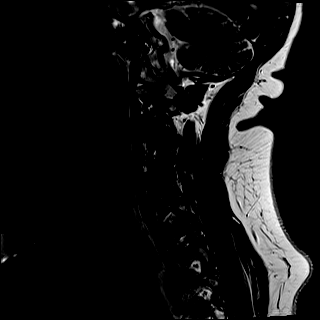
[im 15/15]
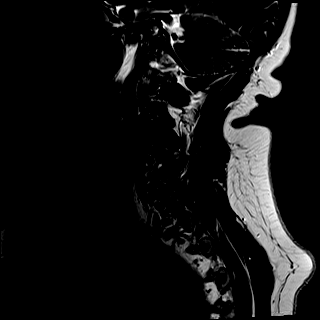

[Series 6: T1 · sagittal · 3.0mm · 0.86mm/px · 6 of 15 slices shown]
[im 1/15]
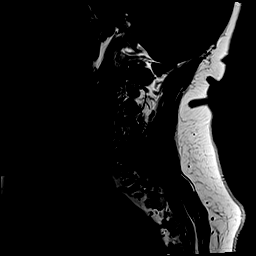
[im 3/15]
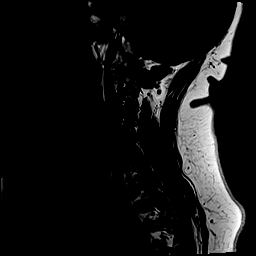
[im 6/15]
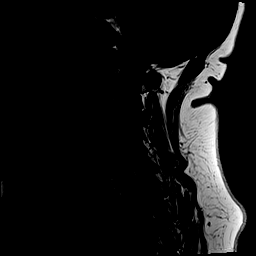
[im 9/15]
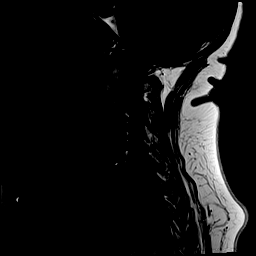
[im 12/15]
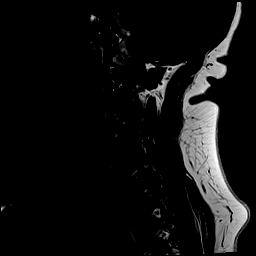
[im 15/15]
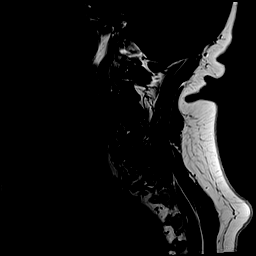

[Series 7: STIR · sagittal · 3.0mm · 0.69mm/px · 6 of 15 slices shown]
[im 1/15]
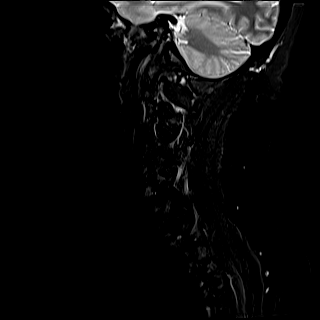
[im 3/15]
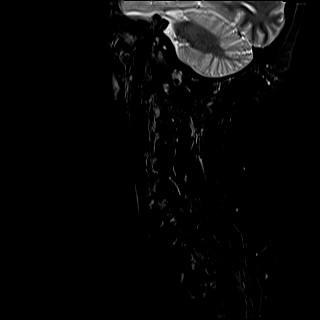
[im 6/15]
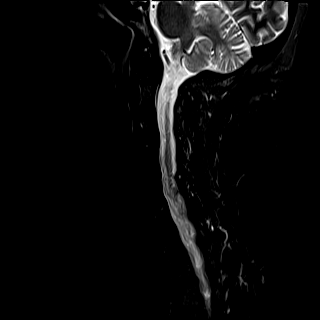
[im 9/15]
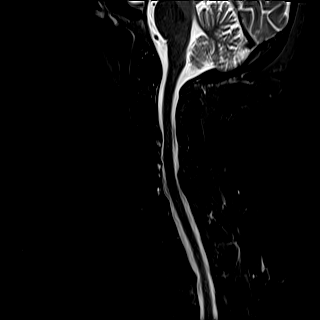
[im 12/15]
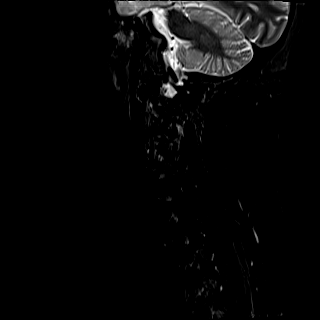
[im 15/15]
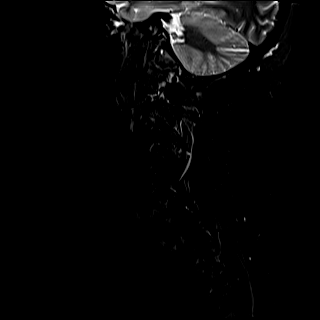

[Series 8: T2 · axial · 3.0mm · 0.70mm/px · z∈[-143,-15]mm · 13 of 40 slices shown (2 of 2)]
[im 1/40]
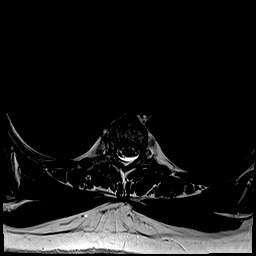
[im 3/40]
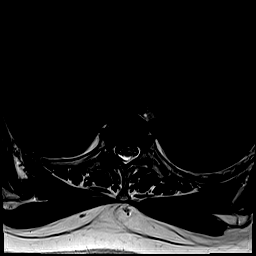
[im 6/40]
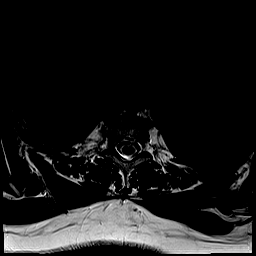
[im 9/40]
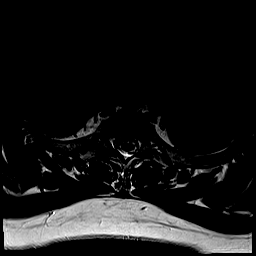
[im 12/40]
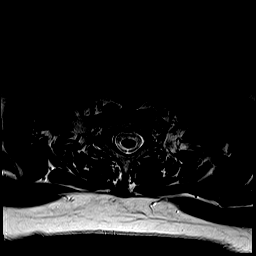
[im 14/40]
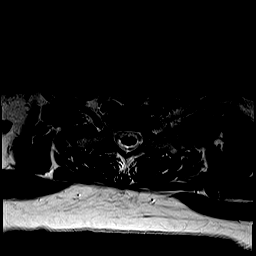
[im 17/40]
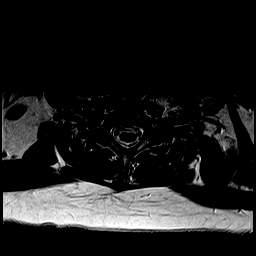
[im 20/40]
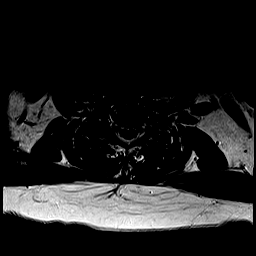
[im 23/40]
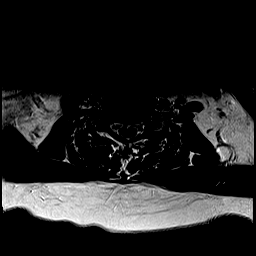
[im 26/40]
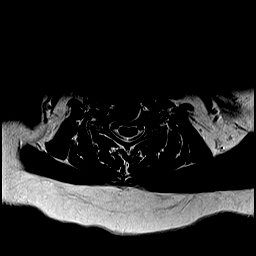
[im 28/40]
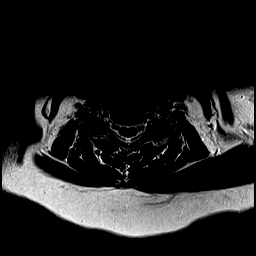
[im 34/40]
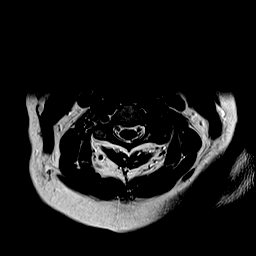
[im 40/40]
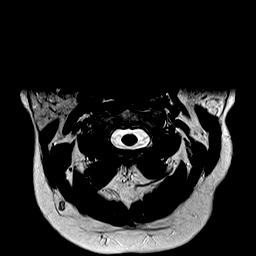

[Series 9: GRE · axial · 3.0mm · 0.35mm/px · z∈[-143,-15]mm · 8 of 40 slices shown]
[im 1/40]
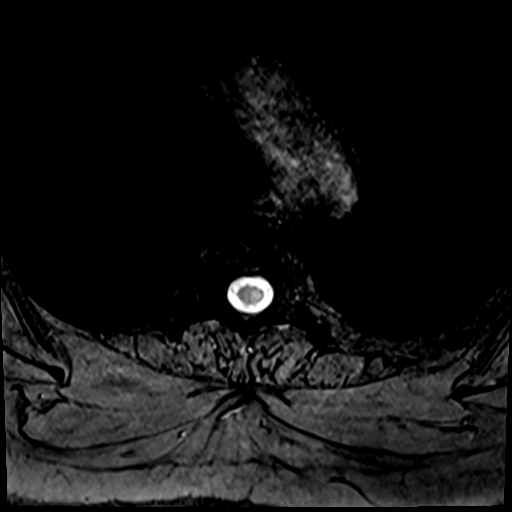
[im 6/40]
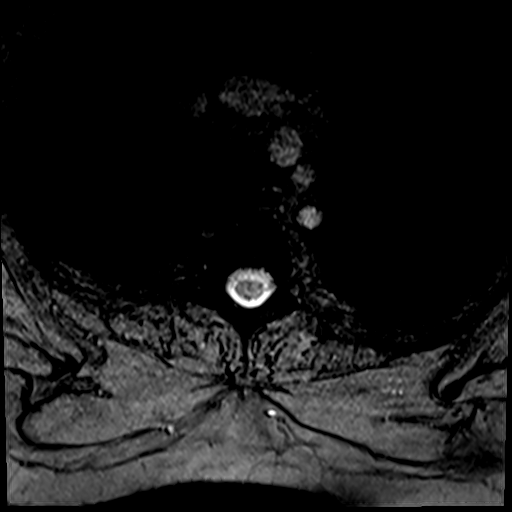
[im 12/40]
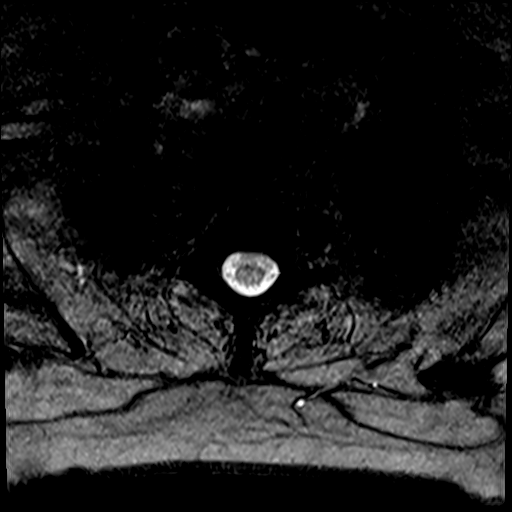
[im 17/40]
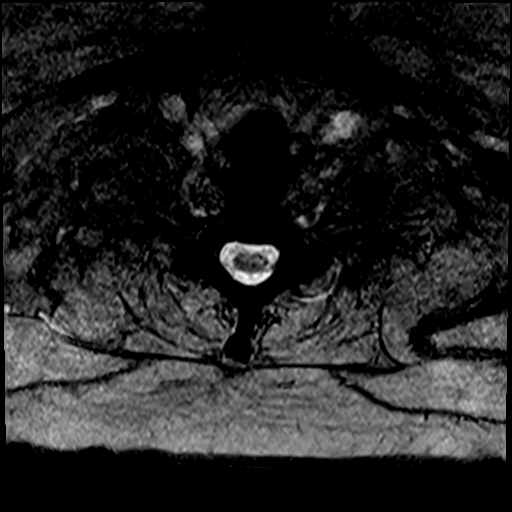
[im 23/40]
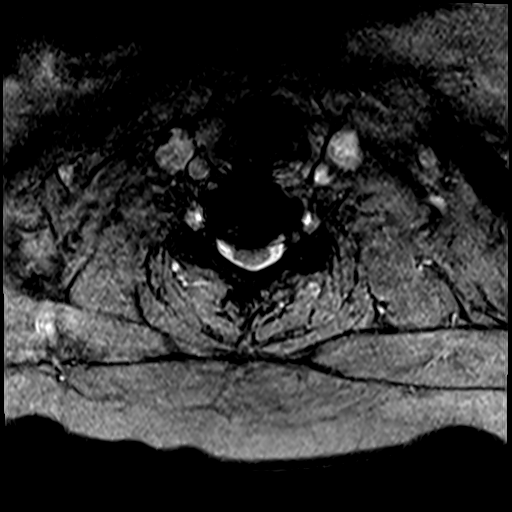
[im 28/40]
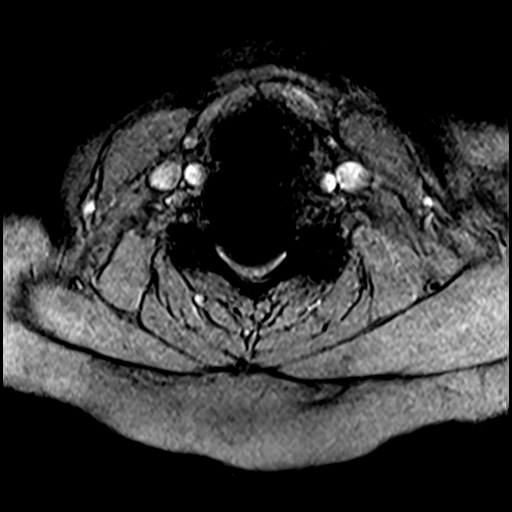
[im 34/40]
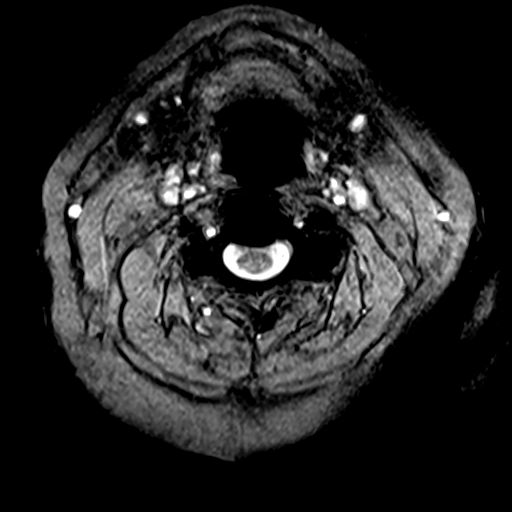
[im 40/40]
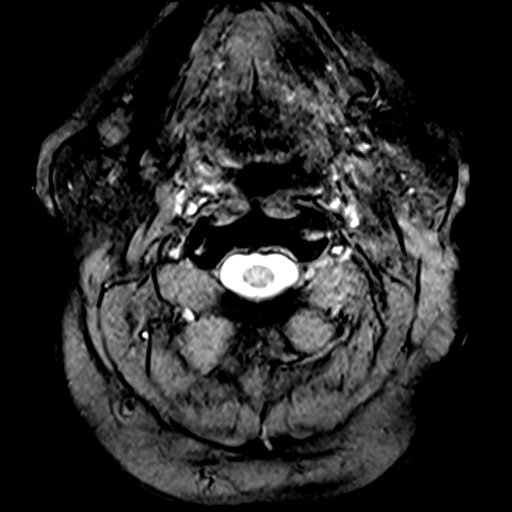

[39 of 48 positions shown; findings below may reference images not displayed]

FINDINGS: Alignment: Straightening of the cervical curvature. Trace
anterolisthesis of C3 over C4.

Vertebrae: No fracture, evidence of discitis, or bone lesion.
Postsurgical changes from C4 through C6 ACDF

Cord: Normal signal and morphology.

Posterior Fossa, vertebral arteries, paraspinal tissues: Negative.

Disc levels:

C2-3: Hypertrophic left facet degenerative changes resulting in mild
left neural foraminal narrowing, progressed from prior MRI.

C3-4: Posterior disc protrusion causes small indentation of the
thecal sac without significant spinal canal stenosis. Mild
uncovertebral and prominent hypertrophic facet degenerative changes
on the left resulting in severe left neural foraminal narrowing,
significantly progressed from prior.

C4-5: Left facet degenerative changes. No spinal canal or neural
foraminal stenosis.

C5-6: Uncovertebral degenerative changes result in severe right and
moderate left neural foraminal narrowing. No spinal canal stenosis.

C6-7: Small posterior disc protrusion without significant spinal
canal stenosis. Hypertrophic facet degenerative changes without
significant neural foraminal narrowing.

C7-T1: Mild facet degenerative changes. No spinal canal or neural
foraminal stenosis.
IMPRESSION: 1. Degenerative changes of the cervical spine, more pronounced at
the level of the facet joints, with progression since prior MRI.
2. Severe left neural foraminal narrowing at C3-4.
3. Severe right and moderate left neural foraminal narrowing at
C5-6.
4. Mild left neural foraminal narrowing at C2-3.
5. No high-grade spinal canal stenosis at any level.
